# Patient Record
Sex: Female | Born: 1985 | Hispanic: Yes | Marital: Single | Smoking: Never smoker
Health system: Southern US, Community
[De-identification: ages and names within clinical notes are randomized; demographics above are authoritative.]

## PROBLEM LIST (undated history)

## (undated) DIAGNOSIS — R519 Headache, unspecified: Secondary | ICD-10-CM

## (undated) DIAGNOSIS — R51 Headache: Secondary | ICD-10-CM

## (undated) HISTORY — PX: TUBAL LIGATION: SHX77

---

## 2012-11-23 ENCOUNTER — Emergency Department (HOSPITAL_COMMUNITY)
Admission: EM | Admit: 2012-11-23 | Discharge: 2012-11-24 | Disposition: A | Discharge status: Home / Self Care | Payer: Self-pay | Attending: Emergency Medicine | Admitting: Emergency Medicine

## 2012-11-23 ENCOUNTER — Emergency Department (INDEPENDENT_AMBULATORY_CARE_PROVIDER_SITE_OTHER)
Admission: EM | Admit: 2012-11-23 | Discharge: 2012-11-23 | Disposition: A | Discharge status: Nursing Home (Includes ICF) | Payer: Self-pay | Source: Home / Self Care

## 2012-11-23 ENCOUNTER — Encounter (HOSPITAL_COMMUNITY): Payer: Self-pay

## 2012-11-23 ENCOUNTER — Emergency Department (HOSPITAL_COMMUNITY): Payer: Self-pay

## 2012-11-23 ENCOUNTER — Encounter (HOSPITAL_COMMUNITY): Payer: Self-pay | Admitting: Emergency Medicine

## 2012-11-23 DIAGNOSIS — J069 Acute upper respiratory infection, unspecified: Secondary | ICD-10-CM

## 2012-11-23 DIAGNOSIS — Z3202 Encounter for pregnancy test, result negative: Secondary | ICD-10-CM | POA: Insufficient documentation

## 2012-11-23 DIAGNOSIS — R112 Nausea with vomiting, unspecified: Secondary | ICD-10-CM

## 2012-11-23 DIAGNOSIS — R300 Dysuria: Secondary | ICD-10-CM

## 2012-11-23 DIAGNOSIS — R109 Unspecified abdominal pain: Secondary | ICD-10-CM

## 2012-11-23 DIAGNOSIS — R1024 Suprapubic pain: Secondary | ICD-10-CM

## 2012-11-23 DIAGNOSIS — R102 Pelvic and perineal pain: Secondary | ICD-10-CM

## 2012-11-23 DIAGNOSIS — E860 Dehydration: Secondary | ICD-10-CM

## 2012-11-23 DIAGNOSIS — N12 Tubulo-interstitial nephritis, not specified as acute or chronic: Secondary | ICD-10-CM | POA: Insufficient documentation

## 2012-11-23 DIAGNOSIS — I959 Hypotension, unspecified: Secondary | ICD-10-CM

## 2012-11-23 LAB — CBC WITH DIFFERENTIAL/PLATELET
Basophils Absolute: 0 10*3/uL (ref 0.0–0.1)
Basophils Absolute: 0 10*3/uL (ref 0.0–0.1)
Basophils Relative: 0 % (ref 0–1)
Basophils Relative: 0 % (ref 0–1)
Eosinophils Relative: 0 % (ref 0–5)
Lymphocytes Relative: 10 % — ABNORMAL LOW (ref 12–46)
Lymphocytes Relative: 14 % (ref 12–46)
MCHC: 35.1 g/dL (ref 30.0–36.0)
MCV: 88 fL (ref 78.0–100.0)
Neutro Abs: 7.5 10*3/uL (ref 1.7–7.7)
Neutrophils Relative %: 74 % (ref 43–77)
Platelets: 213 10*3/uL (ref 150–400)
Platelets: 219 10*3/uL (ref 150–400)
RDW: 12.6 % (ref 11.5–15.5)
RDW: 12.4 % (ref 11.5–15.5)
WBC: 10.8 10*3/uL — ABNORMAL HIGH (ref 4.0–10.5)
WBC: 10.1 10*3/uL (ref 4.0–10.5)

## 2012-11-23 LAB — BASIC METABOLIC PANEL
CO2: 23 mEq/L (ref 19–32)
Calcium: 8.7 mg/dL (ref 8.4–10.5)
GFR calc Af Amer: >90 mL/min (ref >90)
GFR calc non Af Amer: >90 mL/min (ref >90)
Sodium: 134 mEq/L — ABNORMAL LOW (ref 135–145)

## 2012-11-23 LAB — URINALYSIS, ROUTINE W REFLEX MICROSCOPIC
Glucose, UA: NEGATIVE mg/dL
Nitrite: POSITIVE — AB
Protein, ur: 30 mg/dL — AB
Urobilinogen, UA: 0.2 mg/dL (ref 0.0–1.0)

## 2012-11-23 LAB — COMPREHENSIVE METABOLIC PANEL
ALT: 10 U/L (ref 0–35)
AST: 19 U/L (ref 0–37)
Albumin: 3.3 g/dL — ABNORMAL LOW (ref 3.5–5.2)
CO2: 23 mEq/L (ref 19–32)
Chloride: 100 mEq/L (ref 96–112)
GFR calc non Af Amer: >90 mL/min (ref >90)
Sodium: 136 mEq/L (ref 135–145)
Total Bilirubin: 0.3 mg/dL (ref 0.3–1.2)

## 2012-11-23 LAB — PREGNANCY, URINE: Preg Test, Ur: NEGATIVE

## 2012-11-23 LAB — URINE MICROSCOPIC-ADD ON

## 2012-11-23 LAB — POCT PREGNANCY, URINE: Preg Test, Ur: NEGATIVE

## 2012-11-23 MED ORDER — SODIUM CHLORIDE 0.9 % IV BOLUS (SEPSIS)
1000.0000 mL | Freq: Once | INTRAVENOUS | Status: AC
  Administered 2012-11-23: 1000 mL via INTRAVENOUS

## 2012-11-23 MED ORDER — HYDROCODONE-ACETAMINOPHEN 5-325 MG PO TABS: 1.0000 | ORAL_TABLET | ORAL | Status: DC | PRN

## 2012-11-23 MED ORDER — IOHEXOL 300 MG/ML  SOLN: 20.0000 mL | INTRAMUSCULAR | Status: AC

## 2012-11-23 MED ORDER — ACETAMINOPHEN 325 MG PO TABS
650.0000 mg | ORAL_TABLET | Freq: Once | ORAL | Status: AC
  Administered 2012-11-23: 650 mg via ORAL
  Filled 2012-11-23: qty 2

## 2012-11-23 MED ORDER — SODIUM CHLORIDE 0.9 % IV SOLN
1000.0000 mL | INTRAVENOUS | Status: DC
  Administered 2012-11-23: 1000 mL via INTRAVENOUS

## 2012-11-23 MED ORDER — PROMETHAZINE HCL 25 MG PO TABS: 25.0000 mg | ORAL_TABLET | Freq: Four times a day (QID) | ORAL | Status: DC | PRN

## 2012-11-23 MED ORDER — ONDANSETRON HCL 4 MG PO TABS: 8.0000 mg | ORAL_TABLET | Freq: Four times a day (QID) | ORAL | Status: DC

## 2012-11-23 MED ORDER — ACETAMINOPHEN 325 MG PO TABS
650.0000 mg | ORAL_TABLET | Freq: Once | ORAL | Status: AC
  Administered 2012-11-23: 650 mg via ORAL

## 2012-11-23 MED ORDER — CEPHALEXIN 500 MG PO CAPS: 500.0000 mg | ORAL_CAPSULE | Freq: Four times a day (QID) | ORAL | Status: DC

## 2012-11-23 MED ORDER — SODIUM CHLORIDE 0.9 % IV SOLN
1000.0000 mL | Freq: Once | INTRAVENOUS | Status: AC
  Administered 2012-11-23: 1000 mL via INTRAVENOUS

## 2012-11-23 MED ORDER — IOHEXOL 300 MG/ML  SOLN
100.0000 mL | Freq: Once | INTRAMUSCULAR | Status: AC | PRN
  Administered 2012-11-23: 100 mL via INTRAVENOUS

## 2012-11-23 MED ORDER — ONDANSETRON HCL 4 MG/2ML IJ SOLN
4.0000 mg | Freq: Once | INTRAMUSCULAR | Status: AC
  Administered 2012-11-23: 4 mg via INTRAVENOUS
  Filled 2012-11-23: qty 2

## 2012-11-23 MED ORDER — DEXTROSE 5 % IV SOLN
1.0000 g | Freq: Once | INTRAVENOUS | Status: AC
  Administered 2012-11-23: 1 g via INTRAVENOUS
  Filled 2012-11-23: qty 10

## 2012-11-23 NOTE — ED Notes (Signed)
Complain of flu like symptoms-fever chills started 2 days ago

## 2012-11-23 NOTE — ED Provider Notes (Signed)
History     CSN: [id number]  Arrival date & time 11/23/12  1426   First MD Initiated Contact with Patient 11/23/12 1709     Level 5 language barrier Chief Complaint  Patient presents with  . Fever    (Consider location/radiation/quality/duration/timing/severity/associated sxs/prior treatment) Patient is a 27 y.o. female presenting with fever.  Fever Primary symptoms of the febrile illness include fever.  History obtained through interpreter Patient with nausea,vomiting, fever, chills, and body aches for three days.  Sent here by clinic due to dehydration.  Patient has not take any meds but given ibuprofen.  No vomiting here and denies abdominal pain.  Pain in muscles.  Paatient with known sick contacts.No flu shot.  LMP October g1p1 History reviewed. No pertinent past medical history.  Past Surgical History  Procedure Date  . Cesarean section     History reviewed. No pertinent family history.  History  Substance Use Topics  . Smoking status: Not on file  . Smokeless tobacco: Not on file  . Alcohol Use:     OB History    Grav Para Term Preterm Abortions TAB SAB Ect Mult Living                  Review of Systems  Constitutional: Positive for fever.  All other systems reviewed and are negative.    Allergies  Review of patient's allergies indicates no known allergies.  Home Medications   Current Outpatient Rx  Name  Route  Sig  Dispense  Refill  . IBUPROFEN 200 MG PO TABS   Oral   Take 600 mg by mouth every 6 (six) hours as needed. For headache           BP 95/59  Pulse 116  Temp 100.3 F (37.9 C) (Oral)  SpO2 100%  LMP 11/23/2012  Physical Exam  Nursing note and vitals reviewed. Constitutional: She appears well-developed and well-nourished.  HENT:  Head: Normocephalic and atraumatic.  Eyes: Conjunctivae normal and EOM are normal. Pupils are equal, round, and reactive to light.  Neck: Normal range of motion. Neck supple.  Cardiovascular: Regular  rhythm, normal heart sounds and intact distal pulses.  Tachycardia present.   Pulmonary/Chest: Effort normal and breath sounds normal.  Abdominal: Soft. Bowel sounds are normal.  Musculoskeletal: Normal range of motion.  Neurological: She is alert.  Skin: Skin is warm and dry.  Psychiatric: She has a normal mood and affect. Thought content normal.    ED Course  Procedures (including critical care time)  Labs Reviewed  CBC WITH DIFFERENTIAL - Abnormal; Notable for the following:    WBC 10.8 (*)     Neutrophils Relative 82 (*)     Neutro Abs 8.8 (*)     Lymphocytes Relative 10 (*)     All other components within normal limits  BASIC METABOLIC PANEL - Abnormal; Notable for the following:    Sodium 134 (*)     Potassium 3.0 (*)     Glucose, Bld 143 (*)     All other components within normal limits  URINALYSIS, ROUTINE W REFLEX MICROSCOPIC   No results found.   No diagnosis found.    MDM  Patient presents with nausea, vomiting, and fever with abdominal pain.  Patient with rlq pain on exam.  Patient with urine consistent with pyelonephritis and receiving iv fluids and iv rocephin.  Patient hydrated and no vomiting here.  CT pending.  Discussed with Abbey Chatters and she will review ct results and  patient status and likely discharge.      Hilario Quarry, MD 11/28/12 [phone]

## 2012-11-23 NOTE — ED Provider Notes (Signed)
History   CSN: [id number]  Arrival date & time 11/23/12  1136  Chief Complaint  Patient presents with  . Influenza   Patient is a 27 y.o. female presenting with flu symptoms. The history is provided by the patient. A language interpreter was used.  Influenza Pertinent negatives include no chest pain and no shortness of breath.   2 days of illness.  Pt started having headache and pain in the muscles of the legs.  Pt says that she has been exposed to a child that has cold and cough.  Pt says she is having chills.  Pt says that she has been having fever of [phone].  Pt says that she is having nausea and vomiting and abdominal pain.  No diarrhea.  No cough or shortness of breath.  Having sore throat.  Pt says that she has been drinking Gatorade.  The patient reports that she's not able to keep liquids down.  She reports that when the liquid gets to her stomach she becomes nauseated and starts vomiting.  She reports that she's having severe pain in her legs.  She reports she's having a severe headache as well.  She reports that she is having burning with urination.  She reports that she is having no cough.  No rash reported.   History reviewed. No pertinent past medical history.  Past Surgical History  Procedure Date  . Cesarean section    No family history on file.  History  Substance Use Topics  . Smoking status: Not on file  . Smokeless tobacco: Not on file  . Alcohol Use:     OB History    Grav Para Term Preterm Abortions TAB SAB Ect Mult Living                 Review of Systems  Constitutional: Positive for fever, chills and fatigue.  HENT: Positive for congestion, rhinorrhea and postnasal drip. Negative for hearing loss, ear pain, nosebleeds, facial swelling, sneezing, neck pain, neck stiffness and tinnitus.   Eyes: Negative.   Respiratory: Positive for chest tightness. Negative for apnea, cough, choking, shortness of breath, wheezing and stridor.   Cardiovascular: Negative for  chest pain and leg swelling.  Gastrointestinal: Negative for abdominal distention.       Nausea and vomiting   Genitourinary: Positive for dysuria and urgency. Negative for hematuria, vaginal bleeding, vaginal discharge and difficulty urinating.  Musculoskeletal: Negative.   Neurological: Positive for dizziness, weakness and light-headedness.  Psychiatric/Behavioral: Negative.     Allergies  Review of patient's allergies indicates no known allergies.  Home Medications  No current outpatient prescriptions on file.  BP 84/58  Pulse 143  Temp 99.3 F (37.4 C) (Oral)  Resp 21  SpO2 93%  LMP 11/23/2012  Physical Exam  Nursing note and vitals reviewed. Constitutional: She is oriented to person, place, and time. She appears well-developed and well-nourished. No distress.  HENT:  Head: Normocephalic and atraumatic.       Dry mucous membranes noted Swollen nasal turbinates with crusting noted and pallor  Eyes: EOM are normal. Pupils are equal, round, and reactive to light.  Neck: Normal range of motion. Neck supple. No thyromegaly present.  Cardiovascular: Regular rhythm and normal heart sounds.        Tachycardic rate  Pulmonary/Chest: Effort normal. No respiratory distress. She has no wheezes. She has no rales. She exhibits no tenderness.  Abdominal: Soft. Bowel sounds are normal.  Musculoskeletal: She exhibits tenderness. She exhibits no edema.  Neurological:  She is alert and oriented to person, place, and time.  Skin: Skin is warm and dry. No rash noted.  Psychiatric: She has a normal mood and affect. Her behavior is normal. Judgment and thought content normal.   ED Course  Procedures (including critical care time)  Labs Reviewed - No data to display No results found.  No diagnosis found.   MDM  IMPRESSION  Fever  Upper respiratory infection  Possible influenza  Hypertension  Dehydration  Tachycardia  Nausea and vomiting  Dysuria  RECOMMENDATIONS /  PLAN The patient has become dehydrated and not able to keep down fluids.  Because of this, I'm sending her to the emergency department for IV fluids and further workup and treatment.  The patient most likely will not be a candidate for Tamiflu therapy because she's been sick for approximately 3 days now.  She's going to need a urinalysis to evaluate her suprapubic tenderness and dysuria.  I've ordered for her records to be sent to the emergency department.  We called and notified emergency department of her impending visit.  We shuttled her to the emergency department.    FOLLOW UP After ER evaluation  The patient was given clear instructions to go to ER or return to medical center if symptoms don't improve, worsen or new problems develop.  The patient verbalized understanding.  The patient was told to call to get lab results if they haven't heard anything in the next week.           Cleora Fleet, MD 11/23/12 [phone]

## 2012-11-23 NOTE — ED Notes (Signed)
Pt sent from [facility] for eval for fever and dehydration; pt with flu like sx x 3 days with generalized body aches

## 2012-11-24 NOTE — ED Provider Notes (Signed)
Medical screening examination/treatment/procedure(s) were performed by non-physician practitioner and as supervising physician I was immediately available for consultation/collaboration.  Bryla Burek R. Sabre Leonetti, MD 11/24/12 2332 

## 2012-11-24 NOTE — ED Provider Notes (Signed)
Pt received from Dr. Rosalia Hammers.  Pt being treated w/ rocephin, tylenol, zofran and tylenol in ED for pyelonephritis.  CT abd/pelvis ordered to r/o appendicitis d/t RLQ tenderness on exam.  CT shows right-sided pyelo and is otherwise nml.  Results discussed w/ pt through interpreter.  She reports feeling better.  No vomiting in ED and is tolerating pos.  Fever has resolved, pt is no longer tachycardic and BP stable.  D/c'd home w/ keflex, promethazine and vicodin.  Strict return precautions discussed.   Otilio Miu, PA-C 11/24/12 2126

## 2012-11-25 LAB — URINE CULTURE

## 2012-11-26 NOTE — ED Notes (Signed)
+   urine Patient treated with Kelfex-sensitive to same-chart appended per protocol.

## 2016-01-29 ENCOUNTER — Encounter (HOSPITAL_COMMUNITY): Payer: Self-pay

## 2016-01-29 ENCOUNTER — Emergency Department (HOSPITAL_COMMUNITY): Payer: Self-pay

## 2016-01-29 ENCOUNTER — Emergency Department (HOSPITAL_COMMUNITY)
Admission: EM | Admit: 2016-01-29 | Discharge: 2016-01-29 | Disposition: A | Discharge status: Home / Self Care | Payer: Self-pay | Attending: Emergency Medicine | Admitting: Emergency Medicine

## 2016-01-29 DIAGNOSIS — N83201 Unspecified ovarian cyst, right side: Secondary | ICD-10-CM | POA: Insufficient documentation

## 2016-01-29 DIAGNOSIS — N898 Other specified noninflammatory disorders of vagina: Secondary | ICD-10-CM | POA: Insufficient documentation

## 2016-01-29 DIAGNOSIS — R1031 Right lower quadrant pain: Secondary | ICD-10-CM

## 2016-01-29 DIAGNOSIS — Z9851 Tubal ligation status: Secondary | ICD-10-CM | POA: Insufficient documentation

## 2016-01-29 DIAGNOSIS — R300 Dysuria: Secondary | ICD-10-CM | POA: Insufficient documentation

## 2016-01-29 DIAGNOSIS — Z711 Person with feared health complaint in whom no diagnosis is made: Secondary | ICD-10-CM

## 2016-01-29 DIAGNOSIS — Z9889 Other specified postprocedural states: Secondary | ICD-10-CM | POA: Insufficient documentation

## 2016-01-29 DIAGNOSIS — Z3202 Encounter for pregnancy test, result negative: Secondary | ICD-10-CM | POA: Insufficient documentation

## 2016-01-29 DIAGNOSIS — Z202 Contact with and (suspected) exposure to infections with a predominantly sexual mode of transmission: Secondary | ICD-10-CM | POA: Insufficient documentation

## 2016-01-29 DIAGNOSIS — R350 Frequency of micturition: Secondary | ICD-10-CM | POA: Insufficient documentation

## 2016-01-29 HISTORY — DX: Headache, unspecified: R51.9

## 2016-01-29 HISTORY — DX: Headache: R51

## 2016-01-29 LAB — WET PREP, GENITAL
SPERM: NONE SEEN
TRICH WET PREP: NONE SEEN

## 2016-01-29 LAB — CBC
HCT: 39.2 % (ref 36.0–46.0)
Hemoglobin: 12.9 g/dL (ref 12.0–15.0)
MCH: 30.1 pg (ref 26.0–34.0)
MCHC: 32.9 g/dL (ref 30.0–36.0)
MCV: 91.4 fL (ref 78.0–100.0)
PLATELETS: 238 10*3/uL (ref 150–400)
RBC: 4.29 MIL/uL (ref 3.87–5.11)
RDW: 12.7 % (ref 11.5–15.5)
WBC: 5.3 10*3/uL (ref 4.0–10.5)

## 2016-01-29 LAB — COMPREHENSIVE METABOLIC PANEL
ALT: 19 U/L (ref 14–54)
AST: 22 U/L (ref 15–41)
Albumin: 4.4 g/dL (ref 3.5–5.0)
Alkaline Phosphatase: 63 U/L (ref 38–126)
Anion gap: 9 (ref 5–15)
BILIRUBIN TOTAL: 0.8 mg/dL (ref 0.3–1.2)
BUN: 12 mg/dL (ref 6–20)
CHLORIDE: 106 mmol/L (ref 101–111)
CO2: 24 mmol/L (ref 22–32)
CREATININE: 0.62 mg/dL (ref 0.44–1.00)
Calcium: 9 mg/dL (ref 8.9–10.3)
Glucose, Bld: 95 mg/dL (ref 65–99)
Potassium: 3.9 mmol/L (ref 3.5–5.1)
Sodium: 139 mmol/L (ref 135–145)
TOTAL PROTEIN: 7.1 g/dL (ref 6.5–8.1)

## 2016-01-29 LAB — I-STAT BETA HCG BLOOD, ED (MC, WL, AP ONLY)

## 2016-01-29 LAB — URINALYSIS, ROUTINE W REFLEX MICROSCOPIC
Bilirubin Urine: NEGATIVE
Glucose, UA: NEGATIVE mg/dL
Hgb urine dipstick: NEGATIVE
KETONES UR: NEGATIVE mg/dL
LEUKOCYTES UA: NEGATIVE
NITRITE: NEGATIVE
PROTEIN: NEGATIVE mg/dL
Specific Gravity, Urine: 1.028 (ref 1.005–1.030)
pH: 6.5 (ref 5.0–8.0)

## 2016-01-29 LAB — LIPASE, BLOOD: LIPASE: 23 U/L (ref 11–51)

## 2016-01-29 MED ORDER — ONDANSETRON 4 MG PO TBDP: 4.0000 mg | ORAL_TABLET | Freq: Three times a day (TID) | ORAL | Status: AC | PRN

## 2016-01-29 MED ORDER — SODIUM CHLORIDE 0.9 % IV BOLUS (SEPSIS)
1000.0000 mL | Freq: Once | INTRAVENOUS | Status: AC
  Administered 2016-01-29: 1000 mL via INTRAVENOUS

## 2016-01-29 MED ORDER — OXYCODONE-ACETAMINOPHEN 5-325 MG PO TABS
1.0000 | ORAL_TABLET | Freq: Once | ORAL | Status: AC
  Administered 2016-01-29: 1 via ORAL
  Filled 2016-01-29: qty 1

## 2016-01-29 MED ORDER — CEFTRIAXONE SODIUM 250 MG IJ SOLR
250.0000 mg | Freq: Once | INTRAMUSCULAR | Status: AC
  Administered 2016-01-29: 250 mg via INTRAMUSCULAR
  Filled 2016-01-29: qty 250

## 2016-01-29 MED ORDER — ONDANSETRON HCL 4 MG/2ML IJ SOLN
4.0000 mg | Freq: Once | INTRAMUSCULAR | Status: AC
  Administered 2016-01-29: 4 mg via INTRAVENOUS
  Filled 2016-01-29: qty 2

## 2016-01-29 MED ORDER — ONDANSETRON 4 MG PO TBDP
4.0000 mg | ORAL_TABLET | Freq: Once | ORAL | Status: AC
  Administered 2016-01-29: 4 mg via ORAL
  Filled 2016-01-29: qty 1

## 2016-01-29 MED ORDER — METRONIDAZOLE 500 MG PO TABS
2000.0000 mg | ORAL_TABLET | Freq: Once | ORAL | Status: AC
  Administered 2016-01-29: 2000 mg via ORAL
  Filled 2016-01-29: qty 4

## 2016-01-29 MED ORDER — LIDOCAINE HCL (PF) 1 % IJ SOLN
INTRAMUSCULAR | Status: AC
  Administered 2016-01-29: 1 mL
  Filled 2016-01-29: qty 5

## 2016-01-29 MED ORDER — FLUCONAZOLE 150 MG PO TABS: 150.0000 mg | ORAL_TABLET | Freq: Once | ORAL | Status: AC

## 2016-01-29 MED ORDER — MORPHINE SULFATE (PF) 4 MG/ML IV SOLN
4.0000 mg | Freq: Once | INTRAVENOUS | Status: AC
  Administered 2016-01-29: 4 mg via INTRAVENOUS
  Filled 2016-01-29: qty 1

## 2016-01-29 MED ORDER — IOHEXOL 300 MG/ML  SOLN
100.0000 mL | Freq: Once | INTRAMUSCULAR | Status: AC | PRN
  Administered 2016-01-29: 100 mL via INTRAVENOUS

## 2016-01-29 MED ORDER — AZITHROMYCIN 250 MG PO TABS
1000.0000 mg | ORAL_TABLET | Freq: Once | ORAL | Status: AC
  Administered 2016-01-29: 1000 mg via ORAL
  Filled 2016-01-29: qty 4

## 2016-01-29 MED ORDER — NAPROXEN 250 MG PO TABS: 250.0000 mg | ORAL_TABLET | Freq: Two times a day (BID) | ORAL | Status: AC

## 2016-01-29 MED ORDER — IOHEXOL 300 MG/ML  SOLN
25.0000 mL | Freq: Once | INTRAMUSCULAR | Status: AC | PRN
  Administered 2016-01-29: 25 mL via ORAL

## 2016-01-29 NOTE — Progress Notes (Signed)
EDCM spoke to patient at bedside. Patient confirms she [place] a pcp [place]  [facility], informed patient of services there.  EDCM also provided patient with list of pcps who accept self pay patients, list of discount pharmacies and websites needymeds.org and GoodRX.com for medication assistance, phone number to inquire about the orange card, phone number to inquire about Mediciad, phone number to inquire about the Affordable Care Act, financial resources in the community such as local churches, [employer], urban ministries, and dental assistance for uninsured patients.  Patient thankful for resources.  No further EDCM needs at this time.  This information was provided to the patient in Spanish. Patient confirmed she understood information presented to her.

## 2016-01-29 NOTE — ED Provider Notes (Signed)
CSN: [id number]     Arrival date & time 01/29/16  1325 History   First MD Initiated Contact with Patient 01/29/16 1746     Chief Complaint  Patient presents with  . Abdominal Pain    Carol Galloway is a 30 y.o. female who presents to the ED complaining of right lower quadrant abdominal pain for the past 3 days. She also reports associated nausea and vomiting. She reports gradual onset of right lower quadrant abdominal pain that has worsened today. She currently complains of 7 out of 10 right lower quadrant abdominal pain. She reports feeling nauseated and has vomited 3 times today. No diarrhea. Patient also reports dysuria and urinary frequency. She also reports some malodorous vaginal discharge for the past 5 days. No vaginal bleeding. She denies previous abdominal surgeries. She denies fevers, hematemesis, hematochezia, diarrhea, hematuria, flank pain, back pain, coughing, chest pain, shortness of breath, vaginal bleeding, or rashes.    Patient is a 30 y.o. female presenting with abdominal pain. The history is provided by the patient. The history is limited by a language barrier. A language interpreter was used.  Abdominal Pain Associated symptoms: dysuria, nausea, vaginal discharge and vomiting   Associated symptoms: no chest pain, no chills, no cough, no diarrhea, no fever, no hematuria, no shortness of breath, no sore throat and no vaginal bleeding     Past Medical History  Diagnosis Date  . Headache    Past Surgical History  Procedure Laterality Date  . Cesarean section    . Tubal ligation     History reviewed. No pertinent family history. Social History  Substance Use Topics  . Smoking status: Never Smoker   . Smokeless tobacco: None  . Alcohol Use: Yes     Comment: social   OB History    No data available     Review of Systems  Constitutional: Negative for fever and chills.  HENT: Negative for congestion and sore throat.   Eyes: Negative for visual disturbance.   Respiratory: Negative for cough, shortness of breath and wheezing.   Cardiovascular: Negative for chest pain and palpitations.  Gastrointestinal: Positive for nausea, vomiting and abdominal pain. Negative for diarrhea and blood in stool.  Genitourinary: Positive for dysuria, frequency and vaginal discharge. Negative for hematuria, flank pain, decreased urine volume, vaginal bleeding, difficulty urinating, genital sores, menstrual problem and pelvic pain.  Musculoskeletal: Negative for back pain and neck pain.  Skin: Negative for rash.  Neurological: Negative for syncope, weakness, light-headedness and headaches.      Allergies  Review of patient's allergies indicates no known allergies.  Home Medications   Prior to Admission medications   Medication Sig Start Date End Date Taking? Authorizing Provider  ibuprofen (ADVIL,MOTRIN) 200 MG tablet Take 600 mg by mouth every 6 (six) hours as needed. For headache   Yes Historical Provider, MD  fluconazole (DIFLUCAN) 150 MG tablet Take 1 tablet (150 mg total) by mouth once. 01/29/16   Everlene Farrier, PA-C  naproxen (NAPROSYN) 250 MG tablet Take 1 tablet (250 mg total) by mouth 2 (two) times daily with a meal. 01/29/16   Everlene Farrier, PA-C  ondansetron (ZOFRAN ODT) 4 MG disintegrating tablet Take 1 tablet (4 mg total) by mouth every 8 (eight) hours as needed for nausea or vomiting. 01/29/16   Everlene Farrier, PA-C   BP 120/83 mmHg  Pulse 83  Temp(Src) 98.4 F (36.9 C) (Oral)  Resp 18  SpO2 100%  LMP 01/15/2016 Physical Exam  Constitutional: She appears well-developed  and well-nourished. No distress.  Nontoxic appearing.  HENT:  Head: Normocephalic and atraumatic.  Mouth/Throat: Oropharynx is clear and moist.  Eyes: Conjunctivae are normal. Pupils are equal, round, and reactive to light. Right eye exhibits no discharge. Left eye exhibits no discharge.  Neck: Neck supple.  Cardiovascular: Normal rate, regular rhythm, normal heart sounds and  intact distal pulses.  Exam reveals no gallop and no friction rub.   No murmur heard. Pulmonary/Chest: Effort normal and breath sounds normal. No respiratory distress. She has no wheezes. She has no rales.  Abdominal: Soft. Bowel sounds are normal. She exhibits no distension and no mass. There is tenderness. There is rebound. There is no guarding.  Abdomen is soft. Bowel sounds are present. Patient has McBurney's point tenderness and Rovsing sign. No upper abdominal tenderness. No murphy's point tenderness. No CVA or flank tenderness. No psoas or obturator sign.   Genitourinary: Vaginal discharge found.  Pelvic exam performed by me with female nurse tech at chaperone. Patient has a moderate amount of white vaginal discharge. Cervix is closed. No cervical motion tenderness. Patient has mild right adnexal tenderness to palpation. No vaginal bleeding.   Musculoskeletal: She exhibits no edema.  Lymphadenopathy:    She has no cervical adenopathy.  Neurological: She is alert. Coordination normal.  Skin: Skin is warm and dry. No rash noted. She is not diaphoretic. No erythema. No pallor.  Psychiatric: She has a normal mood and affect. Her behavior is normal.  Nursing note and vitals reviewed.   ED Course  Procedures (including critical care time) Labs Review Labs Reviewed  WET PREP, GENITAL - Abnormal; Notable for the following:    Yeast Wet Prep HPF POC RARE (*)    Clue Cells Wet Prep HPF POC MANY (*)    WBC, Wet Prep HPF POC MANY (*)    All other components within normal limits  URINALYSIS, ROUTINE W REFLEX MICROSCOPIC (NOT AT [facility]) - Abnormal; Notable for the following:    APPearance CLOUDY (*)    All other components within normal limits  LIPASE, BLOOD  COMPREHENSIVE METABOLIC PANEL  CBC  RPR  HIV ANTIBODY (ROUTINE TESTING)  I-STAT BETA HCG BLOOD, ED ([facility], [facility], [facility] ONLY)  GC/CHLAMYDIA PROBE AMP ([place]) NOT AT [facility]    Imaging Review Ct Abdomen Pelvis W Contrast  01/29/2016   CLINICAL DATA:  Right lower quadrant abdominal pain starting 3 days ago. Pain worsening today. Vomiting for 3 days. EXAM: CT ABDOMEN AND PELVIS WITH CONTRAST TECHNIQUE: Multidetector CT imaging of the abdomen and pelvis was performed using the standard protocol following bolus administration of intravenous contrast. CONTRAST:  25mL OMNIPAQUE IOHEXOL 300 MG/ML SOLN, 100mL OMNIPAQUE IOHEXOL 300 MG/ML SOLN COMPARISON:  None. FINDINGS: Lower chest:  No acute findings. Hepatobiliary: No masses or other significant abnormality. Pancreas: No mass, inflammatory changes, or other significant abnormality. Spleen: Within normal limits in size and appearance. Adrenals/Urinary Tract: Mild scarring within the right renal cortex, likely sequela of previous infection and/or reflux. There is mild fullness of the renal pelves bilaterally commensurate to the moderate bladder distention. No hydronephrosis. No renal or ureteral calculi. Bladder is unremarkable. Stomach/Bowel: Bowel is normal in caliber. No bowel wall thickening or evidence of bowel wall inflammation. Stomach appears normal. Appendix is normal. Vascular/Lymphatic: No pathologically enlarged lymph nodes. No evidence of abdominal aortic aneurysm. Reproductive: Small right ovarian cyst versus normal dominant follicle. Trace free fluid in the lower pelvis is likely physiologic in nature. Left adnexal region is unremarkable. Other: No significant  free fluid. No abscess collections seen. No free intraperitoneal air. Musculoskeletal: No acute or suspicious osseous lesion superficial soft tissues are unremarkable. IMPRESSION: 1. Small right ovarian cyst, versus more likely normal dominant follicle, measuring 2 cm. Trace free fluid in the lower pelvis is likely physiologic in nature, commensurate to recent ovarian cyst or follicle rupture. 2. Remainder of the abdomen and pelvis CT is unremarkable. No bowel obstruction or evidence of bowel wall inflammation. No renal or ureteral  calculi. Appendix is normal. 3. Incidental note made of chronic scarring within the right renal cortex, presumably related to previous infection and/or vesicoureteral reflux. Electronically Signed   By: Bary Richard M.D.   On: 01/29/2016 19:49   I have personally reviewed and evaluated these images and lab results as part of my medical decision-making.   EKG Interpretation None     Filed Vitals:   01/29/16 1517 01/29/16 2015 01/29/16 2207 01/29/16 2247  BP: 124/86 115/66 116/70 120/83  Pulse: 109 70 65 83  Temp: 98.7 F (37.1 C) 98.4 F (36.9 C) 98.4 F (36.9 C) 98.4 F (36.9 C)  TempSrc: Oral Oral Oral Oral  Resp: SpO2: 99% 99% 100% 100%    MDM   Meds given in ED:  Medications  oxyCODONE-acetaminophen (PERCOCET/ROXICET) 5-325 MG per tablet 1 tablet (1 tablet Oral Given 01/29/16 1410)  sodium chloride 0.9 % bolus 1,000 mL (0 mLs Intravenous Stopped 01/29/16 2138)  ondansetron (ZOFRAN) injection 4 mg (4 mg Intravenous Given 01/29/16 1903)  morphine 4 MG/ML injection 4 mg (4 mg Intravenous Given 01/29/16 1903)  iohexol (OMNIPAQUE) 300 MG/ML solution 25 mL (25 mLs Oral Contrast Given 01/29/16 1905)  iohexol (OMNIPAQUE) 300 MG/ML solution 100 mL (100 mLs Intravenous Contrast Given 01/29/16 1934)  cefTRIAXone (ROCEPHIN) injection 250 mg (250 mg Intramuscular Given 01/29/16 2256)  azithromycin (ZITHROMAX) tablet 1,000 mg (1,000 mg Oral Given 01/29/16 2256)  metroNIDAZOLE (FLAGYL) tablet 2,000 mg (2,000 mg Oral Given 01/29/16 2256)  ondansetron (ZOFRAN-ODT) disintegrating tablet 4 mg (4 mg Oral Given 01/29/16 2255)  lidocaine (PF) (XYLOCAINE) 1 % injection (1 mL  Given 01/29/16 2257)    Discharge Medication List as of 01/29/2016 10:50 PM    START taking these medications   Details  fluconazole (DIFLUCAN) 150 MG tablet Take 1 tablet (150 mg total) by mouth once., Starting 01/29/2016, Print    naproxen (NAPROSYN) 250 MG tablet Take 1 tablet (250 mg total) by mouth 2 (two) times  daily with a meal., Starting 01/29/2016, Until Discontinued, Print    ondansetron (ZOFRAN ODT) 4 MG disintegrating tablet Take 1 tablet (4 mg total) by mouth every 8 (eight) hours as needed for nausea or vomiting., Starting 01/29/2016, Until Discontinued, Print        Final diagnoses:  Right ovarian cyst  RLQ abdominal pain  Concern about STD in female without diagnosis   This  is a 30 y.o. female who presents to the ED complaining of right lower quadrant abdominal pain for the past 3 days. She also reports associated nausea and vomiting. She reports gradual onset of right lower quadrant abdominal pain that has worsened today. She currently complains of 7 out of 10 right lower quadrant abdominal pain. She reports feeling nauseated and has vomited 3 times today. No diarrhea. Patient also reports dysuria and urinary frequency. She also reports some malodorous vaginal discharge for the past 5 days. No vaginal bleeding.  On exam the patient is afebrile and nontoxic appearing. Her abdomen is soft and  she has right lower quadrant tenderness to palpation with positive Rovsing sign. Pelvic exam shows moderate white vaginal discharge. No cervical motion tenderness. Some right adnexal tenderness. Urinalysis is nitrite and leukocyte negative. She is a negative pregnancy test. Lipase is 23. CMP is within normal limits. CBC is within normal limits. CT abdomen and pelvis revealed small right ovarian cysts with trace free fluid in the lower pelvis that is considered to recent ovarian cyst or follicle rupture. No other acute findings. Appendix is normal. Wet prep returned with many white blood cells, many clue cells and rare yeast. Will provide the patient with Rocephin, azithromycin and 2 g of Flagyl in the emergency department. Will discharge with Diflucan, Zofran and naproxen.  I discussed the CT scan findings and advised that she has pending labs for gonorrhea, chlamydia, syphilis and gonorrhea. I encouraged her to  follow-up with the women's outpatient clinic. I discussed her treatment specific return precautions. I advised the patient to follow-up with their primary care provider this week. I advised the patient to return to the emergency department with new or worsening symptoms or new concerns. The patient verbalized understanding and agreement with plan.        Everlene Farrier, PA-C 01/30/16 0106  Donnetta Hutching, MD 01/30/16 [phone]

## 2016-01-29 NOTE — ED Notes (Signed)
Per pt, rt lower quad abdominal pain starting x 3 days ago.  Wasn't as bad.  Today pain has worsened.  Vomiting x 3 days.  No fever.  No diarrhea.  Frequent urination.  Urine is darker and cloudy

## 2016-01-29 NOTE — ED Notes (Signed)
Pain medication given in Triage. Patient advised about side effects of medications and  to avoid driving for a minimum of 4 hours.  

## 2016-01-29 NOTE — ED Notes (Signed)
Pt transported to CT ?

## 2016-01-29 NOTE — ED Notes (Signed)
Instructions and triage per language interpretor.

## 2016-01-29 NOTE — Discharge Instructions (Signed)
Dolor abdominal en adultos (Abdominal Pain, Adult) El dolor puede tener muchas causas. Normalmente la causa del dolor abdominal no es una enfermedad y Scientist, clinical (histocompatibility and immunogenetics) sin TEFL teacher. Frecuentemente puede controlarse y tratarse en casa. Su mdico le Medical sales representative examen fsico y posiblemente solicite anlisis de sangre y radiografas para ayudar a Chief Strategy Officer la gravedad de su dolor. Sin embargo, en [employer], debe transcurrir ms tiempo antes de que se pueda Clinical research associate una causa evidente del dolor. Antes de llegar a ese punto, es posible que su mdico no sepa si necesita ms pruebas o un tratamiento ms profundo. INSTRUCCIONES PARA EL CUIDADO EN EL HOGAR  Est atento al dolor para ver si hay cambios. Las siguientes indicaciones ayudarn a Architectural technologist que pueda sentir:  [place] solo medicamentos de venta libre o recetados, segn las indicaciones del mdico.  No tome laxantes a menos que se lo haya indicado su mdico.  Pruebe con Neomia Dear dieta lquida absoluta (caldo, t o agua) segn se lo indique su mdico. Introduzca gradualmente una dieta normal, segn su tolerancia. SOLICITE ATENCIN MDICA SI:  Tiene dolor abdominal sin explicacin.  Tiene dolor abdominal relacionado con nuseas o diarrea.  Tiene dolor cuando orina o defeca.  Experimenta dolor abdominal que lo despierta de noche.  Tiene dolor abdominal que empeora o mejora cuando come alimentos.  Tiene dolor abdominal que empeora cuando come alimentos grasosos.  Tiene fiebre. SOLICITE ATENCIN MDICA DE INMEDIATO SI:   El dolor no desaparece en un plazo mximo de 2horas.  No deja de (vomitar).  El Engineer, mining se siente solo en partes del abdomen, como el lado derecho o la parte inferior izquierda del abdomen.  Evaca materia fecal sanguinolenta o negra, de aspecto alquitranado. ASEGRESE DE QUE:  Comprende estas instrucciones.  Controlar su afeccin.  Recibir ayuda de inmediato si no mejora o si empeora.   Esta  informacin no tiene Theme park manager el consejo del mdico. Asegrese de hacerle al mdico cualquier pregunta que tenga.   Document Released: 11/03/2005 Document Revised: 11/24/2014 Elsevier Interactive Patient Education 2016 [employer]. Quiste ovrico (Ovarian Cyst) Un quiste ovrico es una bolsa llena de lquido que se forma en el ovario. Los ovarios son los rganos pequeos que producen vulos en las mujeres. Se pueden formar varios tipos de [employer]. Katha Hamming no son cancerosos. Muchos de ellos no causan problemas y con frecuencia desaparecen solos. Algunos pueden provocar sntomas y requerir TEFL teacher. Los tipos ms comunes de quistes ovricos son los siguientes:  Quistes funcionales: estos quistes pueden aparecer todos los meses durante el ciclo menstrual. Esto es normal. Estos quistes suelen desaparecer con el prximo ciclo menstrual si la mujer no queda embarazada. En general, los quistes funcionales no tienen sntomas.  Endometriomas: estos quistes se forman a partir del tejido que recubre el tero. Tambin se denominan "quistes de chocolate" porque se llenan de sangre que se vuelve marrn. Este tipo de quiste puede Psychologist, counselling en la zona inferior del abdomen durante la relacin sexual y con el perodo menstrual.  Cistoadenomas: este tipo se desarrolla a partir de las clulas que se [place] en el exterior del ovario. Estos quistes pueden ser muy grandes y causar dolor en la zona inferior del abdomen y durante la relacin sexual. Cleda Clarks tipo de quiste puede girar sobre s mismo, cortar el suministro de Retail buyer y causar un dolor intenso. Tambin se puede romper con facilidad y Physicist, medical.  Quistes dermoides: este tipo de quiste a veces se encuentra en ambos ovarios.  Estos quistes pueden AES Corporationcontener diferentes tipos de tejidos del organismo, como piel, dientes, pelo o TEFL teachercartlago. Generalmente no tienen sntomas, a menos que sean [address]muy grandes.  Quistes tecalutenicos:  aparecen cuando se produce demasiada cantidad de cierta hormona (gonadotropina corinica humana) que estimula en exceso al ovario para que produzca vulos. Esto es ms frecuente despus de procedimientos que ayudan a la concepcin de un beb (fertilizacin in vitro). CAUSAS   Los medicamentos para la fertilidad pueden provocar una afeccin mediante la cual se forman mltiples quistes de gran tamao en los ovarios. Esta se denomina sndrome de hiperestimulacin ovrica.  El sndrome del ovario poliqustico es una afeccin que puede causar desequilibrios hormonales, los cuales pueden dar como resultado quistes ovricos no funcionales. SIGNOS Y SNTOMAS  Muchos quistes ovricos no causan sntomas. Si se presentan sntomas, stos pueden ser:  Dolor o molestias en la pelvis.  Dolor en la parte baja del abdomen.  Dolor durante las The St. Paul Travelersrelaciones sexuales.  Aumento del permetro abdominal (hinchazn).  Perodos menstruales anormales.  Aumento del The TJX Companiesdolor en los perodos [place]menstruales.  Cese de los perodos menstruales sin estar embarazada. DIAGNSTICO  Estos quistes se descubren comnmente durante un examen de rutina o una exploracin ginecolgica anual. Es posible que se ordenen otros estudios para obtener ms informacin sobre el [place]quiste. Estos estudios pueden ser:  Regulatory affairs officercografas.  Radiografas de la pelvis.  Tomografa computada.  Resonancia magntica.  Anlisis de [place]. TRATAMIENTO  Muchos de los quistes ovricos desaparecen por s solos, sin tratamiento. Es probable que el mdico quiera controlar el quiste regularmente durante 2 o 3meses para ver si se produce algn cambio. En el caso de las mujeres en la menopausia, es particularmente importante controlar de cerca al quiste ya que el ndice de cncer de ovario en las mujeres menopusicas es ms alto. Cuando se requiere TEFL teachertratamiento, este puede incluir cualquiera de los siguientes:  Un procedimiento para drenar el quiste (aspiracin). Esto  se puede realizar State Street Corporationmediante el uso de [place]una aguja grande y [place]una ecografa. Tambin se puede hacer a travs de un procedimiento laparoscpico, En este procedimiento, se inserta un tubo delgado que emite luz y que tiene una pequea cmara en un extremo (laparoscopio) a travs de una pequea incisin.  Ciruga para extirpar el quiste completo. Esto se puede realizar mediante una ciruga laparoscpica o Neomia Dearuna ciruga abierta, la cual implica realizar una incisin ms grande en la parte inferior del abdomen.  Tratamiento hormonal o pldoras anticonceptivas. Estos mtodos a veces se usan para ayudar a Baristadisolver un quiste. INSTRUCCIONES PARA EL CUIDADO EN EL HOGAR   [place] solo medicamentos de venta libre o recetados, segn las indicaciones del mdico.  OceanographerConcurra a las consultas de control con su mdico segn las indicaciones.  Hgase exmenes plvicos regulares y pruebas de Papanicolaou. SOLICITE ATENCIN MDICA SI:   Los perodos se atrasan, son irregulares, dolorosos o cesan.  El dolor plvico o abdominal no desaparece.  El abdomen se agranda o se hincha.  Siente presin en la vejiga o no puede vaciarla completamente.  Siente dolor durante las The St. Paul Travelersrelaciones sexuales.  Tiene una sensacin de hinchazn, presin o Environmental managermolestias en el estmago.  Pierde peso sin razn aparente.  Siente un Engineer, maintenance (IT)malestar generalizado.  Est estreida.  Pierde el apetito.  Le aparece acn.  Nota un aumento del vello corporal y facial.  Lenora BoysAumenta de peso sin hacer modificaciones en su actividad fsica y en su dieta habitual.  Sospecha que est embarazada. SOLICITE ATENCIN MDICA DE INMEDIATO SI:   Siente cada vez ms  dolor abdominal.  Tiene malestar estomacal (nuseas) y vomita.  Tiene fiebre que se presenta de Irena repentina.  Siente dolor abdominal al defecar.  Sus perodos menstruales son ms abundantes que lo habitual. ASEGRESE DE QUE:   Comprende estas instrucciones.  Controlar su afeccin.  Recibir  ayuda de inmediato si no mejora o si empeora.   Esta informacin no tiene Theme park manager el consejo del mdico. Asegrese de hacerle al mdico cualquier pregunta que tenga.   Document Released: 08/13/2005 Document Revised: 11/08/2013 Elsevier Interactive Patient Education 2016 [employer]. [place] de transmisin sexual (Sexually Transmitted Disease) Neomia Dear enfermedad de transmisin sexual (ETS) es toda infeccin que se contagia (transmite) de Neomia Dear persona a otra durante la actividad sexual. Puede transmitirse a travs de la saliva, el semen, la [place], el moco vaginal o la [place]. Las enfermedades de transmisin sexual ms frecuentes son:   Bettey Mare.  Clamidia.  Sfilis.  VIH y [place].  Herpes genital.  Hepatitis B y C.  Tricomonas.  Virus del papiloma humano (VPH).  [place].  Escabiosis.  [place].  Vaginosis bacteriana. CULES SON LAS CAUSAS DE LAS ETS? Una ETS se transmite por bacterias, virus o parsitos. Las ETS se contagian durante la actividad sexual si una de las personas est infectada. Sin embargo, tambin puede trasmitirse por medios no sexuales. Las ETS se trasmiten despus de:   Tener contacto sexual con un compaero infectado.  Compartir juguetes sexuales.  Compartir agujas con una persona infectada o intercambiar piercings o agujas de tatuajes.  Tener un contacto ntimo con los genitales, la boca, o la zona anal de una persona infectada.  Exposicin a los fluidos infectados durante el nacimiento. CULES SON LOS SIGNOS Y SNTOMAS DE UNA INFECCIN POR ETS? Las distintas enfermedades de transmisin sexual tienen diferentes sntomas. Algunas personas no tienen sntomas. Si se presentan sntomas, estos pueden ser:   Miccin dolorosa o con sangre.  Dolor en la pelvis, el abdomen, la vagina, el ano, la garganta o los ojos.  Erupcin cutnea, picazn o irritacin.  Bultos, lceras, ampollas o llagas en la zona genital o anal.  Flujo vaginal anormal  con o sin mal olor.  En los hombres, secrecin peniana.  Grant Ruts.  Dolor o Physiological scientist.  Inflamacin de los ganglios en la zona de la ingle.  Piel y ojos amarillos (ictericia). Esto se observa con la hepatitis.  Hinchazn de los testculos.  Infertilidad.  Llagas y ampollas en la boca. CMO SE DIAGNOSTICAN LAS ETS? Para realizar un diagnstico, el mdico:   Tax inspector.  Realizar un examen fsico.  [place] de cualquier secrecin que presente para analizarla.  Tomar una muestra de la garganta, el cuello del tero, la abertura del pene, el recto o la vagina para el [place].  Analizar una muestra de la primera orina de la [place].  Le pedir [employer].  Si corresponde, le tomar la [place] para un Papanicolau.  Har una colposcopa.  Har una laparoscopa. CMO SE TRATAN LAS ETS? El tratamiento depende de cul sea la ETS. Algunas ETS pueden tratarse pero no tienen [place].   Clamidia, gonorrea, tricomonas y sfilis pueden curarse con antibiticos.  El herpes genital, la hepatitis y el VIH pueden tratarse, pero no curarse, con medicamentos recetados. Los [employer].  Las Physiological scientist VPH se extirpan utilizando medicamentos o mediante el congelamiento, el quemado (Automotive engineer) o la Leisure centre manager. Las IT consultant.  El VPH no puede curarse con medicamentos o  ciruga. Sin embargo pueden extirparse zonas anormales del cuello del tero, la vagina o la vulva.  Si el diagnstico se confirma, sus compaeros sexuales ms recientes necesitan recibir tratamiento. Deben realizarlo aunque no tengan problemas o sus cultivos o evaluaciones sean negativos. No deben tener relaciones sexuales hasta que sus mdicos los autoricen.  El mdico puede repetirle las pruebas de deteccin de infecciones 3 meses despus del [place]. CMO PUEDO REDUCIR EL RIESGO DE TENER  UNA ETS? Estas acciones le ayudarn a reducir el riesgo de contraer una ETS:  Use condones de ltex, protectores bucales y lubricantes solubles en agua durante la actividad sexual.No utilice vaselina ni aceites.  Evite tener mltiples compaeros sexuales.  No tenga relaciones sexuales con alguien que tiene otros [employer].  No tenga relaciones sexuales con quien no conozca o con quien tenga riesgo de sufrir una ETS.  Evite las prcticas sexuales riesgosas que puedan lacerar la piel.  No tenga relaciones sexuales si tiene llagas abiertas en la boca o piel.  Evite abusar del alcohol o consumir drogas ilegales. El consumo de drogas o alcohol, puede afectar su capacidad de juicio y colocarlo en una posicin vulnerable.  Evite el sexo oral o anal.  Aplquese las vacunas para el VPH y la hepatitis. Si no ha recibido Scientist, product/process development, consulte a su mdico si debe aplicarse una o ambas.  Si tiene riesgo de infectarse por el VIH, se recomienda tomar diariamente un medicamento recetado para evitar la infeccin. Esto se conoce como profilaxis previa a la exposicin. Se considera que est en riesgo si:  Es un hombre que tiene sexo con otros hombres.  Es heterosexual y es activo sexualmente con ms de una pareja.  Se inyecta drogas.  Es [place] sexualmente con Neomia Dear pareja que tiene VIH.  Consulte a su mdico para saber si tiene un alto riesgo de infectarse por el VIH. Si opta por comenzar la profilaxis previa a la exposicin, primero debe realizarse anlisis de deteccin del VIH. Luego, le harn anlisis cada mientras est tomando los medicamentos para la profilaxis previa a la exposicin. QU DEBO HACER SI CREO QUE TENGO UNA ETS?  Consulte a su mdico.  Hable con su pareja sexual. Ellos deben ser evaluados y recibir tratamiento.  No tenga relaciones sexuales hasta que el mdico lo autorice. CUNDO DEBO BUSCAR ASISTENCIA MDICA INMEDIATA? Comunquese con su mdico de  inmediato si:   Siente un dolor abdominal intenso.  Usted es hombre y nota hinchazn o dolor en los testculos.  Usted es mujer y nota hinchazn o dolor en la vagina.   Esta informacin no tiene Theme park manager el consejo del mdico. Asegrese de hacerle al mdico cualquier pregunta que tenga.   Document Released: 08/13/2005 Document Revised: 11/24/2014 Elsevier Interactive Patient Education [employer]

## 2016-01-29 NOTE — ED Notes (Signed)
PT DISCHARGED. INSTRUCTIONS AND PRESCRIPTIONS GIVEN. AAOX3. PT IN NO APPARENT DISTRESS OR PAIN. THE OPPORTUNITY TO ASK QUESTIONS WAS PROVIDED. 

## 2016-01-29 NOTE — ED Notes (Signed)
Pt reports RLQ pain with nausea.  Pt with limited english.  Drinking PO contrast at this time.  Denies any vaginal bleeding.

## 2016-01-30 LAB — RPR: RPR: NONREACTIVE

## 2016-01-30 LAB — GC/CHLAMYDIA PROBE AMP (~~LOC~~) NOT AT ARMC
Chlamydia: NEGATIVE
Neisseria Gonorrhea: NEGATIVE

## 2016-01-30 LAB — HIV ANTIBODY (ROUTINE TESTING W REFLEX): HIV Screen 4th Generation wRfx: NONREACTIVE

## 2016-07-28 IMAGING — CT CT ABD-PELV W/ CM
2 of 4 series · 16 of 46 positions shown, 18 images · IV contrast (omnipaque)
Comparison: None.

CLINICAL DATA: Right lower quadrant abdominal pain starting 3 days
ago. Pain worsening today. Vomiting for 3 days.

EXAM:
CT ABDOMEN AND PELVIS WITH CONTRAST
TECHNIQUE: Multidetector CT imaging of the abdomen and pelvis was performed
using the standard protocol following bolus administration of
intravenous contrast.
CONTRAST:  25mL OMNIPAQUE IOHEXOL 300 MG/ML SOLN, 100mL OMNIPAQUE
IOHEXOL 300 MG/ML SOLN

[Series 2: abd/pel with · axial · 0.66mm/px · z∈[+972,+1372]mm · 13 of 88 slices shown, 15 images]
[im 4/88  soft-tissue]
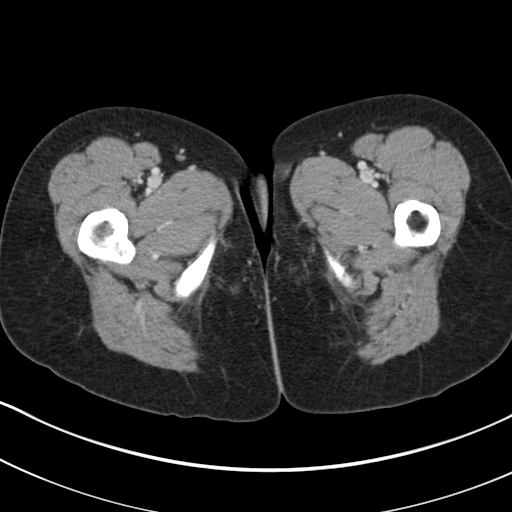
[im 4/88  bone]
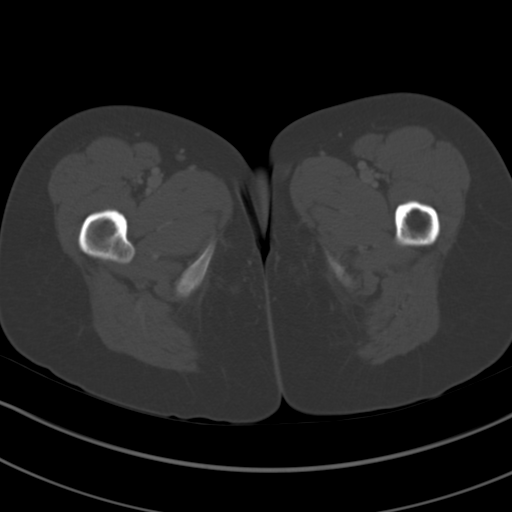
[im 11/88  soft-tissue]
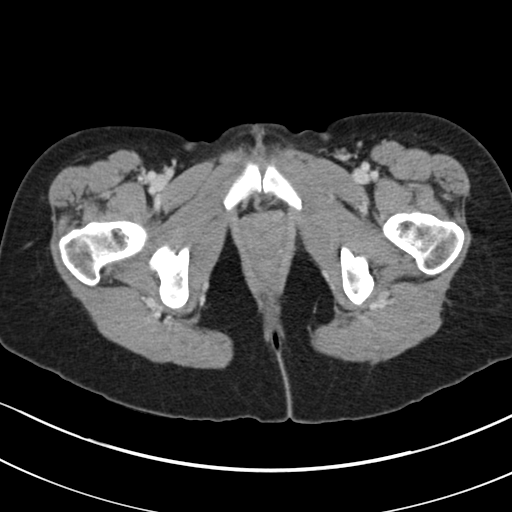
[im 18/88  soft-tissue]
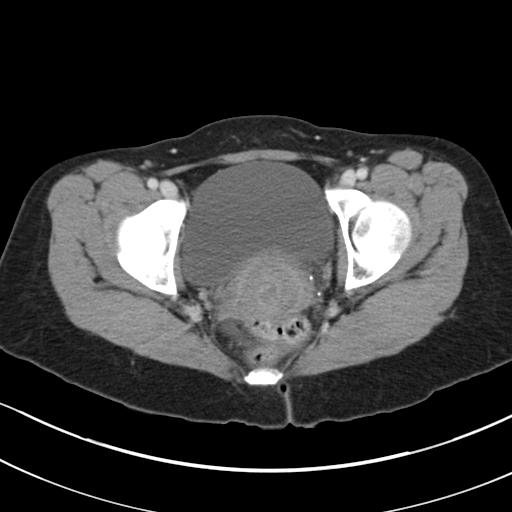
[im 25/88  soft-tissue]
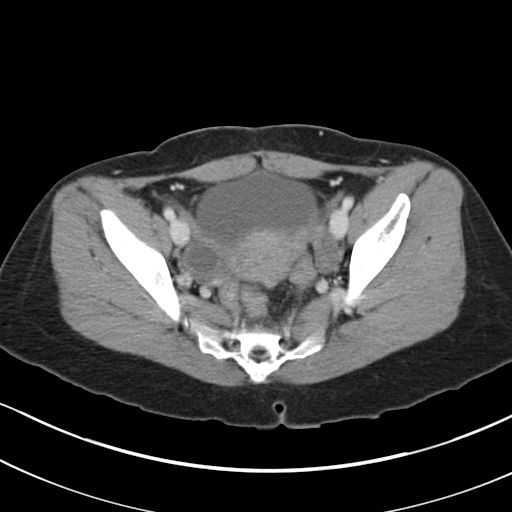
[im 32/88  soft-tissue]
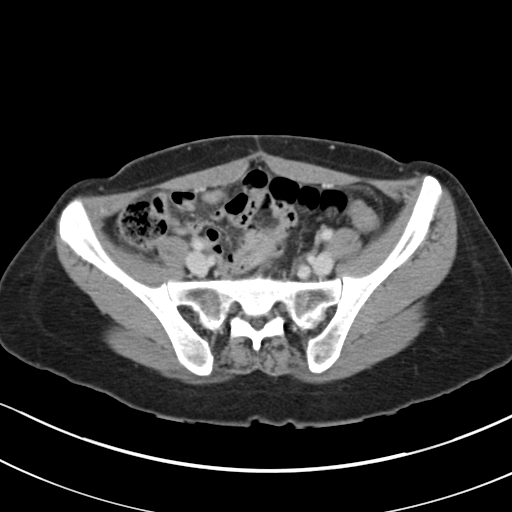
[im 39/88  soft-tissue]
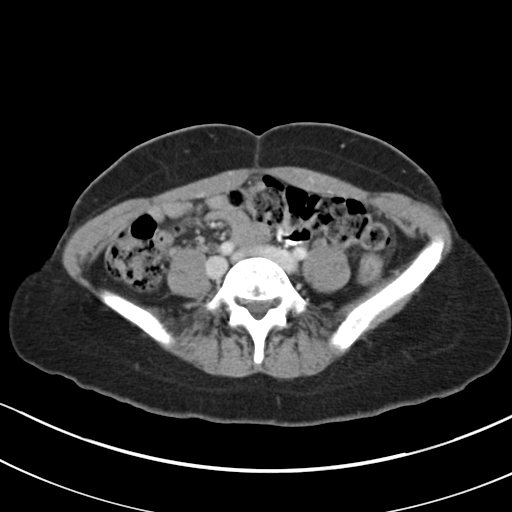
[im 46/88  soft-tissue]
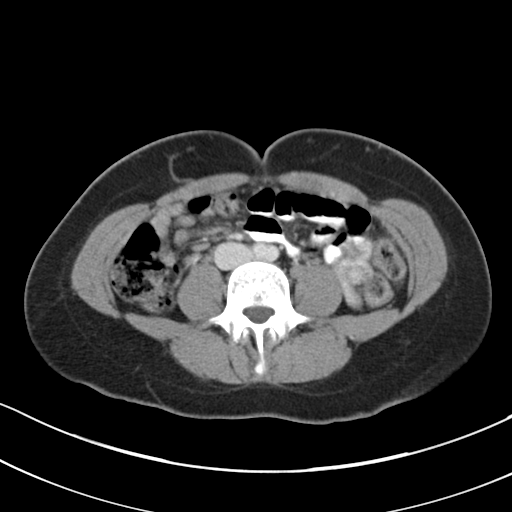
[im 49/88  soft-tissue]
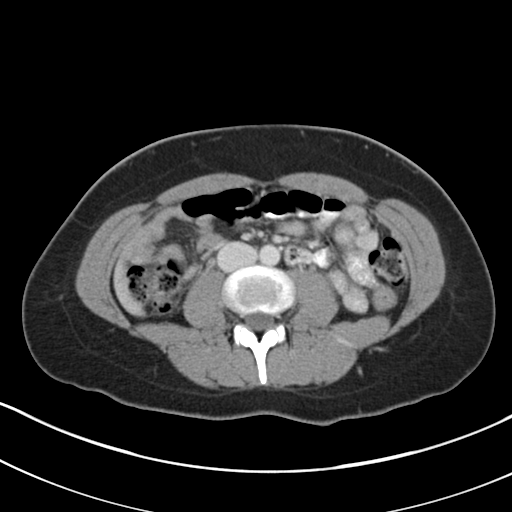
[im 56/88  soft-tissue]
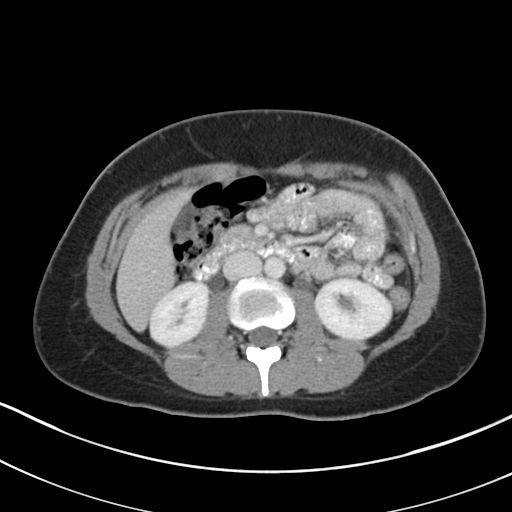
[im 56/88  bone]
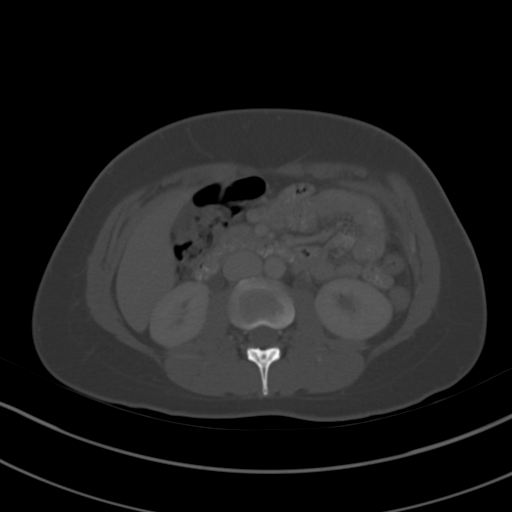
[im 63/88  soft-tissue]
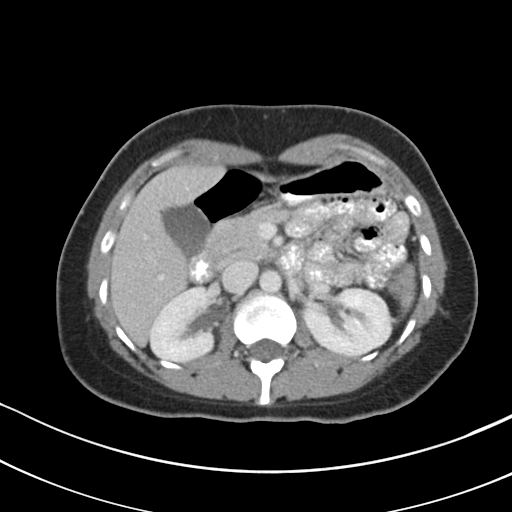
[im 70/88  soft-tissue]
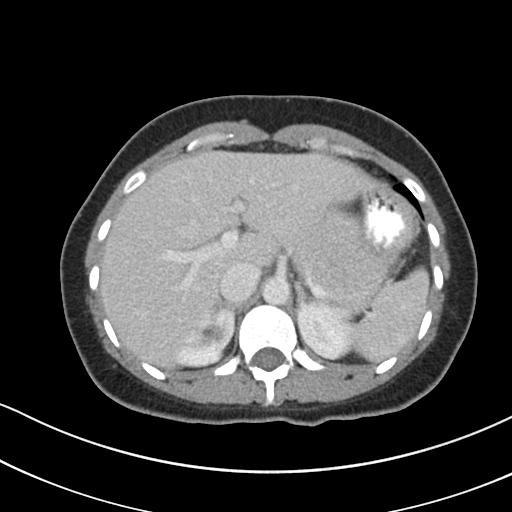
[im 77/88  soft-tissue]
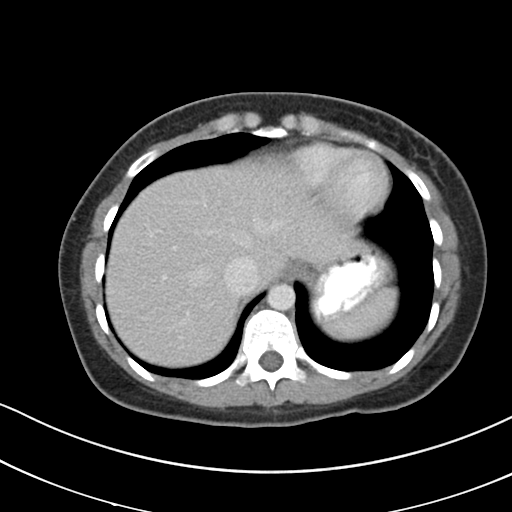
[im 84/88  soft-tissue]
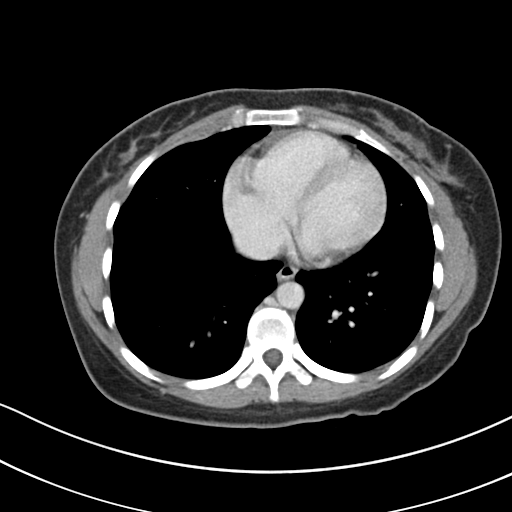

[Series 5: coronal a/|p · coronal · 0.74mm/px · 3 of 75 slices shown]
[im 25/75  soft-tissue]
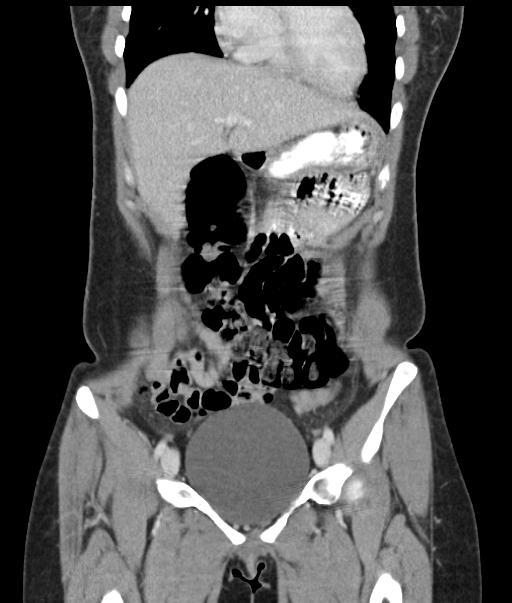
[im 33/75  soft-tissue]
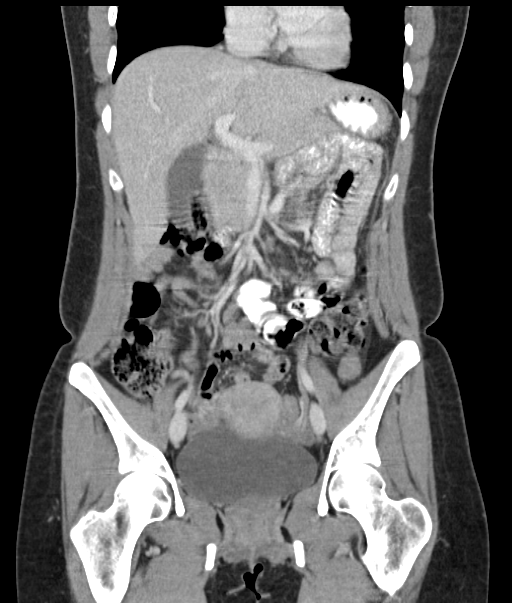
[im 42/75  soft-tissue]
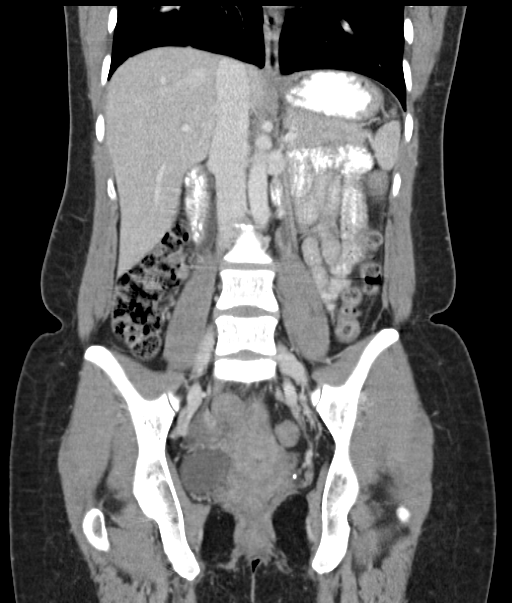

[16 of 46 positions shown; findings below may reference images not displayed]

FINDINGS: Lower chest:  No acute findings.

Hepatobiliary: No masses or other significant abnormality.

Pancreas: No mass, inflammatory changes, or other significant
abnormality.

Spleen: Within normal limits in size and appearance.

Adrenals/Urinary Tract: Mild scarring within the right renal cortex,
likely sequela of previous infection and/or reflux. There is mild
fullness of the renal pelves bilaterally commensurate to the
moderate bladder distention. No hydronephrosis. No renal or ureteral
calculi. Bladder is unremarkable.

Stomach/Bowel: Bowel is normal in caliber. No bowel wall thickening
or evidence of bowel wall inflammation. Stomach appears normal.
Appendix is normal.

Vascular/Lymphatic: No pathologically enlarged lymph nodes. No
evidence of abdominal aortic aneurysm.

Reproductive: Small right ovarian cyst versus normal dominant
follicle. Trace free fluid in the lower pelvis is likely physiologic
in nature. Left adnexal region is unremarkable.

Other: No significant free fluid. No abscess collections seen. No
free intraperitoneal air.

Musculoskeletal: No acute or suspicious osseous lesion superficial
soft tissues are unremarkable.
IMPRESSION: 1. Small right ovarian cyst, versus more likely normal dominant
follicle, measuring 2 cm. Trace free fluid in the lower pelvis is
likely physiologic in nature, commensurate to recent ovarian cyst or
follicle rupture.
2. Remainder of the abdomen and pelvis CT is unremarkable. No bowel
obstruction or evidence of bowel wall inflammation. No renal or
ureteral calculi. Appendix is normal.
3. Incidental note made of chronic scarring within the right renal
cortex, presumably related to previous infection and/or
vesicoureteral reflux.
